# Patient Record
Sex: Male | Born: 1990 | Race: Black or African American | Hispanic: No | Marital: Single | Smoking: Current some day smoker
Health system: Southern US, Community
[De-identification: ages and names within clinical notes are randomized; demographics above are authoritative.]

## PROBLEM LIST (undated history)

## (undated) DIAGNOSIS — J45909 Unspecified asthma, uncomplicated: Secondary | ICD-10-CM

---

## 2014-10-27 ENCOUNTER — Emergency Department (HOSPITAL_COMMUNITY)
Admission: EM | Admit: 2014-10-27 | Discharge: 2014-10-27 | Disposition: A | Discharge status: Home / Self Care | Payer: Federal, State, Local not specified - PPO | Attending: Emergency Medicine | Admitting: Emergency Medicine

## 2014-10-27 ENCOUNTER — Encounter (HOSPITAL_COMMUNITY): Payer: Self-pay | Admitting: Emergency Medicine

## 2014-10-27 DIAGNOSIS — M545 Low back pain: Secondary | ICD-10-CM | POA: Diagnosis present

## 2014-10-27 DIAGNOSIS — X58XXXA Exposure to other specified factors, initial encounter: Secondary | ICD-10-CM | POA: Insufficient documentation

## 2014-10-27 DIAGNOSIS — Y9389 Activity, other specified: Secondary | ICD-10-CM | POA: Diagnosis not present

## 2014-10-27 DIAGNOSIS — Y9289 Other specified places as the place of occurrence of the external cause: Secondary | ICD-10-CM | POA: Diagnosis not present

## 2014-10-27 DIAGNOSIS — S39012A Strain of muscle, fascia and tendon of lower back, initial encounter: Secondary | ICD-10-CM | POA: Diagnosis not present

## 2014-10-27 DIAGNOSIS — S29012A Strain of muscle and tendon of back wall of thorax, initial encounter: Secondary | ICD-10-CM

## 2014-10-27 DIAGNOSIS — Z880 Allergy status to penicillin: Secondary | ICD-10-CM | POA: Insufficient documentation

## 2014-10-27 DIAGNOSIS — Z720 Tobacco use: Secondary | ICD-10-CM | POA: Diagnosis not present

## 2014-10-27 DIAGNOSIS — Y998 Other external cause status: Secondary | ICD-10-CM | POA: Diagnosis not present

## 2014-10-27 DIAGNOSIS — J45909 Unspecified asthma, uncomplicated: Secondary | ICD-10-CM | POA: Diagnosis not present

## 2014-10-27 HISTORY — DX: Unspecified asthma, uncomplicated: J45.909

## 2014-10-27 MED ORDER — METHOCARBAMOL 500 MG PO TABS: 500.0000 mg | ORAL_TABLET | Freq: Two times a day (BID) | ORAL | Status: DC

## 2014-10-27 MED ORDER — IBUPROFEN 800 MG PO TABS: 800.0000 mg | ORAL_TABLET | Freq: Three times a day (TID) | ORAL | Status: DC

## 2014-10-27 NOTE — Discharge Instructions (Signed)
Mid-Back Strain with Rehab  A strain is an injury in which a tendon or muscle is torn. The muscles and tendons of the mid-back are vulnerable to strains. However, these muscles and tendons are very strong and require a great force to be injured. The muscles of the mid-back are responsible for stabilizing the spinal column, as well as spinal twisting (rotation). Strains are classified into three categories. Grade 1 strains cause pain, but the tendon is not lengthened. Grade 2 strains include a lengthened ligament, due to the ligament being stretched or partially ruptured. With grade 2 strains there is still function, although the function may be decreased. Grade 3 strains involve a complete tear of the tendon or muscle, and function is usually impaired. SYMPTOMS   Pain in the middle of the back.  Pain that may affect only one side, and is worse with movement.  Muscle spasms, and often swelling in the back.  Loss of strength of the back muscles.  Crackling sound (crepitation) when the muscles are touched. CAUSES  Mid-back strains occur when a force is placed on the muscles or tendons that is greater than they can handle. Common causes of injury include:  Ongoing overuse of the muscle-tendon units in the middle back, usually from incorrect body posture.  A single violent injury or force applied to the back. RISK INCREASES WITH:  Sports that involve twisting forces on the spine or a lot of bending at the waist (football, rugby, weightlifting, bowling, golf, tennis, speed skating, racquetball, swimming, running, gymnastics, diving).  Poor strength and flexibility.  Failure to warm up properly before activity.  Family history of low back pain or disk disorders.  Previous back injury or surgery (especially fusion). PREVENTION  Learn and use proper sports technique.  Warm up and stretch properly before activity.  Allow for adequate recovery between workouts.  Maintain physical  fitness:  Strength, flexibility, and endurance.  Cardiovascular fitness. PROGNOSIS  If treated properly, mid-back strains usually heal within 6 weeks. RELATED COMPLICATIONS   Frequently recurring symptoms, resulting in a chronic problem. Properly treating the problem the first time decreases frequency of recurrence.  Chronic inflammation, scarring, and partial muscle-tendon tear.  Delayed healing or resolution of symptoms, especially if activity is resumed too soon.  Prolonged disability. TREATMENT Treatment first involves the use of ice and medicine, to reduce pain and inflammation. As the pain begins to subside, you may begin strengthening and stretching exercises to improve body posture and sport technique. These exercises may be performed at home or with a therapist. Severe injuries may require referral to a therapist for further evaluation and treatment, such as ultrasound. Corticosteroid injections may be given to help reduce inflammation. Biofeedback (watching monitors of your body processes) and psychotherapy may also be prescribed. Prolonged bed rest is felt to do more harm than good. Massage may help break the muscle spasms. Sometimes, an injection of cortisone, with or without local anesthetics, may be given to help relieve the pain and spasms. MEDICATION   If pain medicine is needed, nonsteroidal anti-inflammatory medicines (aspirin and ibuprofen), or other minor pain relievers (acetaminophen), are often advised.  Do not take pain medicine for 7 days before surgery.  Prescription pain relievers may be given, if your caregiver thinks they are needed. Use only as directed and only as much as you need.  Ointments applied to the skin may be helpful.  Corticosteroid injections may be given by your caregiver. These injections should be reserved for the most serious cases, because   they may only be given a certain number of times. HEAT AND COLD:   Cold treatment (icing) should be  applied for 10 to 15 minutes every 2 to 3 hours for inflammation and pain, and immediately after activity that aggravates your symptoms. Use ice packs or an ice massage.  Heat treatment may be used before performing stretching and strengthening activities prescribed by your caregiver, physical therapist, or athletic trainer. Use a heat pack or a warm water soak. SEEK IMMEDIATE MEDICAL CARE IF:  Symptoms get worse or do not improve in 2 to 4 weeks, despite treatment.  You develop numbness, weakness, or loss of bowel or bladder function.  New, unexplained symptoms develop. (Drugs used in treatment may produce side effects.) EXERCISES RANGE OF MOTION (ROM) AND STRETCHING EXERCISES - Mid-Back Strain These exercises may help you when beginning to rehabilitate your injury. In order to successfully resolve your symptoms, you must improve your posture. These exercises are designed to help reduce the forward-head and rounded-shoulder posture which contributes to this condition. Your symptoms may resolve with or without further involvement from your physician, physical therapist or athletic trainer. While completing these exercises, remember:   Restoring tissue flexibility helps normal motion to return to the joints. This allows healthier, less painful movement and activity.  An effective stretch should be held for at least 30 seconds.  A stretch should never be painful. You should only feel a gentle lengthening or release in the stretched tissue. STRETCH - Axial Extension  Stand or sit on a firm surface. Assume a good posture: chest up, shoulders drawn back, stomach muscles slightly tense, knees unlocked (if standing) and feet hip width apart.  Slowly retract your chin, so your head slides back and your chin slightly lowers. Continue to look straight ahead.  You should feel a gentle stretch in the back of your head. Be certain not to feel an aggressive stretch since this can cause headaches  later.  Hold for __________ seconds. Repeat __________ times. Complete this exercise __________ times per day. RANGE OF MOTION- Upper Thoracic Extension  Sit on a firm chair with a high back. Assume a good posture: chest up, shoulders drawn back, abdominal muscles slightly tense, and feet hip width apart. Place a small pillow or folded towel in the curve of your lower back, if you are having difficulty maintaining good posture.  Gently brace your neck with your hands, allowing your arms to rest on your chest.  Continue to support your neck and slowly extend your back over the chair. You will feel a stretch across your upper back.  Hold __________ seconds. Slowly return to the starting position. Repeat __________ times. Complete this exercise __________ times per day. RANGE OF MOTION- Mid-Thoracic Extension  Roll a towel so that it is about 4 inches in diameter.  Position the towel lengthwise. Lay on the towel so that your spine, but not your shoulder blades, are supported.  You should feel your mid-back arching toward the floor. To increase the stretch, extend your arms away from your body.  Hold for __________ seconds. Repeat exercise __________ times, __________ times per day. STRENGTHENING EXERCISES - Mid-Back Strain These exercises may help you when beginning to rehabilitate your injury. They may resolve your symptoms with or without further involvement from your physician, physical therapist or athletic trainer. While completing these exercises, remember:   Muscles can gain both the endurance and the strength needed for everyday activities through controlled exercises.  Complete these exercises as instructed by   your physician, physical therapist or athletic trainer. Increase the resistance and repetitions only as guided by your caregiver.  You may experience muscle soreness or fatigue, but the pain or discomfort you are trying to eliminate should never worsen during these  exercises. If this pain does worsen, stop and make certain you are following the directions exactly. If the pain is still present after adjustments, discontinue the exercise until you can discuss the trouble with your caregiver. STRENGTHENING - Quadruped, Opposite UE/LE Lift  Assume a hands and knees position on a firm surface. Keep your hands under your shoulders and your knees under your hips. You may place padding under your knees for comfort.  Find your neutral spine and gently tense your abdominal muscles so that you can maintain this position. Your shoulders and hips should form a rectangle that is parallel with the floor and is not twisted.  Keeping your trunk steady, lift your right hand no higher than your shoulder and then your left leg no higher than your hip. Make sure you are not holding your breath. Hold this position __________ seconds.  Continuing to keep your abdominal muscles tense and your back steady, slowly return to your starting position. Repeat with the opposite arm and leg. Repeat __________ times. Complete this exercise __________ times per day.  STRENGTH - Shoulder Extensors  Secure a rubber exercise band or tubing to a fixed object (table, pole) so that it is at the height of your shoulders when you are either standing, or sitting on a firm armless chair.  With a thumbs-up grip, grasp an end of the band in each hand. Straighten your elbows and lift your hands straight in front of you at shoulder height. Step back away from the secured end of band, until it becomes tense.  Squeezing your shoulder blades together, pull your hands down to the sides of your thighs. Do not allow your hands to go behind you.  Hold for __________ seconds. Slowly ease the tension on the band, as you reverse the directions and return to the starting position. Repeat __________ times. Complete this exercise __________ times per day.  STRENGTH - Horizontal Abductors Choose one of the two  positions to complete this exercise. Prone: lying on stomach:  Lie on your stomach on a firm surface so that your right / left arm overhangs the edge. Rest your forehead on your opposite forearm. With your palm facing the floor and your elbow straight, hold a __________ weight in your hand.  Squeeze your right / left shoulder blade to your mid-back spine and then slowly raise your arm to the height of the bed.  Hold for __________ seconds. Slowly reverse the directions and return to the starting position, controlling the weight as you lower your arm. Repeat __________ times. Complete this exercise __________ times per day. Standing:   Secure a rubber exercise band or tubing, so that it is at the height of your shoulders when you are either standing, or sitting on a firm armless chair.  Grasp an end of the band in each hand and have your palms face each other. Straighten your elbows and lift your hands straight in front of you at shoulder height. Step back away from the secured end of band, until it becomes tense.  Squeeze your shoulder blades together. Keeping your elbows locked and your hands at shoulder height, spread your arms apart, forming a "T" shape with your body. Hold __________ seconds. Slowly ease the tension on the band, as   you reverse the directions and return to the starting position. Repeat __________ times. Complete this exercise __________ times per day. STRENGTH - Scapular Retractors and External Rotators, Rowing  Secure a rubber exercise band or tubing, so that it is at the height of your shoulders when you are either standing, or sitting on a firm armless chair.  With a palm-down grip, grasp an end of the band in each hand. Straighten your elbows and lift your hands straight in front of you at shoulder height. Step back away from the secured end of band, until it becomes tense.  Step 1: Squeeze your shoulder blades together. Bending your elbows, draw your hands to your  chest as if you are rowing a boat. At the end of this motion, your hands and elbow should be at shoulder height and your elbows should be out to your sides.  Step 2: Rotate your shoulder to raise your hands above your head. Your forearms should be vertical and your upper arms should be horizontal.  Hold for __________ seconds. Slowly ease the tension on the band, as you reverse the directions and return to the starting position. Repeat __________ times. Complete this exercise __________ times per day.  POSTURE AND BODY MECHANICS CONSIDERATIONS - Mid-Back Strain Keeping correct posture when sitting, standing or completing your activities will reduce the stress put on different body tissues, allowing injured tissues a chance to heal and limiting painful experiences. The following are general guidelines for improved posture. Your physician or physical therapist will provide you with any instructions specific to your needs. While reading these guidelines, remember:  The exercises prescribed by your provider will help you have the flexibility and strength to maintain correct postures.  The correct posture provides the best environment for your joints to work. All of your joints have less wear and tear when properly supported by a spine with good posture. This means you will experience a healthier, less painful body.  Correct posture must be practiced with all of your activities, especially prolonged sitting and standing. Correct posture is as important when doing repetitive low-stress activities (typing) as it is when doing a single heavy-load activity (lifting). PROPER SITTING POSTURE In order to minimize stress and discomfort on your spine, you must sit with correct posture. Sitting with good posture should be effortless for a healthy body. Returning to good posture is a gradual process. Many people can work toward this most comfortably by using various supports until they have the flexibility and  strength to maintain this posture on their own. When sitting with proper posture, your ears will fall over your shoulders and your shoulders will fall over your hips. You should use the back of the chair to support your upper back. Your lower back will be in a neutral position, just slightly arched. You may place a small pillow or folded towel at the base of your low back for  support.  When working at a desk, create an environment that supports good, upright posture. Without extra support, muscles fatigue and lead to excessive strain on joints and other tissues. Keep these recommendations in mind: CHAIR:  A chair should be able to slide under your desk when your back makes contact with the back of the chair. This allows you to work closely.  The chair's height should allow your eyes to be level with the upper part of your monitor and your hands to be slightly lower than your elbows. BODY POSITION  Your feet should make contact with   the floor. If this is not possible, use a foot rest.  Keep your ears over your shoulders. This will reduce stress on your neck and lower back. INCORRECT SITTING POSTURES If you are feeling tired and unable to assume a healthy sitting posture, do not slouch or slump. This puts excessive strain on your back tissues, causing more damage and pain. Healthier options include:  Using more support, like a lumbar pillow.  Switching tasks to something that requires you to be upright or walking.  Talking a brief walk.  Lying down to rest in a neutral-spine position. CORRECT STANDING POSTURES Proper standing posture should be assumed with all daily activities, even if they only take a few moments, like when brushing your teeth. As in sitting, your ears should fall over your shoulders and your shoulders should fall over your hips. You should keep a slight tension in your abdominal muscles to brace your spine. Your tailbone should point down to the ground, not behind your  body, resulting in an over-extended swayback posture.  INCORRECT STANDING POSTURES Common incorrect standing postures include a forward head, locked knees, and an excessive swayback. WALKING Walk with an upright posture. Your ears, shoulders and hips should all line-up. CORRECT LIFTING TECHNIQUES DO :   Assume a wide stance. This will provide you more stability and the opportunity to get as close as possible to the object which you are lifting.  Tense your abdominals to brace your spine. Bend at the knees and hips. Keeping your back locked in a neutral-spine position, lift using your leg muscles. Lift with your legs, keeping your back straight.  Test the weight of unknown objects before attempting to lift them.  Try to keep your elbows locked down at your sides in order get the best strength from your shoulders when carrying an object.  Always ask for help when lifting heavy or awkward objects. INCORRECT LIFTING TECHNIQUES DO NOT:   Lock your knees when lifting, even if it is a small object.  Bend and twist. Pivot at your feet or move your feet when needing to change directions.  Assume that you can safely pick up even a paperclip without proper posture. Document Released: 06/11/2005 Document Revised: 10/26/2013 Document Reviewed: 09/23/2008 [employer] Patient Information 2015 [employer]. This information is not intended to replace advice given to you by your health care provider. Make sure you discuss any questions you have with your health care provider.  

## 2014-10-27 NOTE — ED Provider Notes (Signed)
CSN: [id number]     Arrival date & time 10/27/14  0005 History   First MD Initiated Contact with Patient 10/27/14 0046     Chief Complaint  Patient presents with  . Back Pain     (Consider location/radiation/quality/duration/timing/severity/associated sxs/prior Treatment) HPI  24 year old male presents for evaluation of back pain. Patient states that he recently started a new job 2 weeks ago working for [employer].  He has to do a lot of repetitive movement. Yesterday after torquing a wheel he pulled his R upper back muscle.  Report sharp throbbing pain with certain arm movement.  Pain is 7/10, worsening with movement and improves with rest and massage.  No associated fever, chills, neck pain, shortness of breath, pleuritic chest pain, productive cough, lightheadedness, dizziness, abdominal pain, or rash. He is here requesting a work note. No other complaint.  Past Medical History  Diagnosis Date  . Asthma    History reviewed. No pertinent past surgical history. History reviewed. No pertinent family history. History  Substance Use Topics  . Smoking status: Current Some Day Smoker  . Smokeless tobacco: Not on file  . Alcohol Use: No    Review of Systems  Constitutional: Negative for fever.  Cardiovascular: Negative for chest pain.  Musculoskeletal: Positive for back pain.  Skin: Negative for rash and wound.  Neurological: Negative for headaches.      Allergies  Penicillins  Home Medications   Prior to Admission medications   Not on File   BP 133/54 mmHg  Pulse 64  Temp(Src) 98.1 F (36.7 C) (Oral)  Resp 22  SpO2 100% Physical Exam  Constitutional: He appears well-developed and well-nourished. No distress.  African-American male appears to be in no acute distress, conversant.  HENT:  Head: Atraumatic.  Eyes: Conjunctivae are normal.  Neck: Neck supple.  No cervical midline spine tenderness, no nuchal rigidity.  Cardiovascular: Normal rate and regular rhythm.    Pulmonary/Chest: Effort normal and breath sounds normal. He exhibits no tenderness.  Abdominal: Soft. There is no tenderness.  Musculoskeletal: He exhibits tenderness (Tenderness to right parathoracic spinal muscle on palpation worsening with back rotation but no overlying skin changes or rash. No crepitus or emphysema. Right shoulder with full range of motion and normal grip strength. Sensation is intact to all extremit).  5/5 strength to bilateral upper extremities  Neurological: He is alert.  Skin: No rash noted.  Psychiatric: He has a normal mood and affect.  Nursing note and vitals reviewed.   ED Course  Procedures (including critical care time)  Patient presents with pain consistence with a muscle strain. Is afebrile with stable normal vital sign. Pain is reproducible on exam. Rice therapy discussed. He'll provided as requested. Low suspicion for ACS or PE causing his symptoms  Labs Review Labs Reviewed - No data to display  Imaging Review No results found.   EKG Interpretation None      MDM   Final diagnoses:  Strain of mid-back, initial encounter    BP 133/54 mmHg  Pulse 64  Temp(Src) 98.1 F (36.7 C) (Oral)  Resp 22  SpO2 100%     Fayrene HelperBowie Shakyla Nolley, PA-C 10/27/14 [id number]  Loren Raceravid Yelverton, MD 10/27/14 [phone]

## 2014-10-27 NOTE — ED Notes (Signed)
Pt reports he believes he pulled a muscle in his back yesterday.

## 2014-10-27 NOTE — ED Notes (Signed)
Pt complains of mid right back pain and right shoulder pain. Denies numbness and tingling. Pt describes pain as aching that started 2 weeks ago while at work. Denies taking any pain medication for symptoms.

## 2016-12-17 ENCOUNTER — Encounter (HOSPITAL_COMMUNITY): Payer: Self-pay

## 2016-12-17 ENCOUNTER — Emergency Department (HOSPITAL_COMMUNITY)
Admission: EM | Admit: 2016-12-17 | Discharge: 2016-12-17 | Disposition: A | Discharge status: Home / Self Care | Payer: Federal, State, Local not specified - PPO | Attending: Emergency Medicine | Admitting: Emergency Medicine

## 2016-12-17 DIAGNOSIS — K1120 Sialoadenitis, unspecified: Secondary | ICD-10-CM | POA: Diagnosis not present

## 2016-12-17 DIAGNOSIS — F17200 Nicotine dependence, unspecified, uncomplicated: Secondary | ICD-10-CM | POA: Diagnosis not present

## 2016-12-17 DIAGNOSIS — Z79899 Other long term (current) drug therapy: Secondary | ICD-10-CM | POA: Insufficient documentation

## 2016-12-17 DIAGNOSIS — J45909 Unspecified asthma, uncomplicated: Secondary | ICD-10-CM | POA: Diagnosis not present

## 2016-12-17 DIAGNOSIS — R220 Localized swelling, mass and lump, head: Secondary | ICD-10-CM | POA: Diagnosis present

## 2016-12-17 NOTE — Discharge Instructions (Signed)
Warm compresses to the swollen side. Sour candy. Follow up with family doctor or return if not improving or worsening.

## 2016-12-17 NOTE — ED Notes (Addendum)
Rt side of face swelling since yesterday , was eating crackers and felt tingling and face swelling  Started , took some allergy meds and it went down and now  This am face is swollen agagin,, pt handling his secreations well no sob

## 2016-12-17 NOTE — ED Triage Notes (Signed)
Pt states he was eating a peanut butter cracker yesterday and began to feel tingling in his face. He reports he also noticed facial swelling to the right side. Some swelling noted, no broken teeth noted. Pt denies any hx of allergy to peanut butter. Pt denies SOB or swelling in the throat.

## 2016-12-17 NOTE — ED Provider Notes (Signed)
[facility]-EMERGENCY DEPT Provider Note   CSN: [id number] Arrival date & time: 12/17/16  1224  By signing my name below, I, Ryan Sweeney, attest that this documentation has been prepared under the direction and in the presence of Ryan Sweeney. Electronically Signed: Thelma BargeNick Sweeney, Scribe. 12/17/16. 2:59 PM.  History   Chief Complaint Chief Complaint  Patient presents with  . Facial Swelling   The history is provided by the patient. No language interpreter was used.   HPI Comments: Ryan Sweeney is a 26 y.o. male who presents to the Emergency Department complaining of constant, gradually worsening right-sided facial swelling since yesterday. He states his face is only mildly painful. He notes he was eating a peanut butter cracker when his symptoms arose. He took allergy medications and ibuprofen with some improvement. He denies any new vitamins or OTC medications, or any allergies to peanut butter. He denies ear pain, dental pain, fever, chills, difficulty swallowing, swelling in his throat, sore throat, rash, itching, swelling to lips, or pain when he moves his mouth. He has no pertinent medical histories.   Past Medical History:  Diagnosis Date  . Asthma     There are no active problems to display for this patient.   History reviewed. No pertinent surgical history.     Home Medications    Prior to Admission medications   Medication Sig Start Date End Date Taking? Authorizing Provider  ibuprofen (ADVIL,MOTRIN) 800 MG tablet Take 1 tablet (800 mg total) by mouth 3 (three) times daily. 10/27/14   Fayrene Helperran, [place], PA-C  methocarbamol (ROBAXIN) 500 MG tablet Take 1 tablet (500 mg total) by mouth 2 (two) times daily. 10/27/14   Fayrene Helperran, [place], PA-C    Family History History reviewed. No pertinent family history.  Social History Social History  Substance Use Topics  . Smoking status: Current Some Day Smoker  . Smokeless tobacco: Never Used  . Alcohol use No     Allergies     Penicillins   Review of Systems Review of Systems  Constitutional: Negative for chills and fever.  HENT: Positive for facial swelling. Negative for dental problem, ear pain, sore throat and trouble swallowing.   Skin: Negative for rash.     Physical Exam Updated Vital Signs BP (!) 138/108 (BP Location: Right Arm)   Pulse 75   Temp 98.4 F (36.9 C) (Oral)   Resp 16   SpO2 98%   Physical Exam  Constitutional: He is oriented to person, place, and time. He appears well-developed and well-nourished.  HENT:  Head: Normocephalic and atraumatic.  Normal lips, tongue, throat. Normal oropharynx. Normal external ear, ear canals, TMs bilaterally. Mild fullness in right parotid gland with no tenderness. No trismus. No swelling under the tongue or submandibular area  Cardiovascular: Normal rate.   Pulmonary/Chest: Effort normal.  Neurological: He is alert and oriented to person, place, and time.  Skin: Skin is warm and dry.  Psychiatric: He has a normal mood and affect.  Nursing note and vitals reviewed.    ED Treatments / Results  DIAGNOSTIC STUDIES: Oxygen Saturation is 98% on RA, normal by my interpretation.    COORDINATION OF CARE: 2:54 PM Discussed treatment plan with pt at bedside and pt agreed to plan.  Labs (all labs ordered are listed, but only abnormal results are displayed) Labs Reviewed - No data to display  EKG  EKG Interpretation None       Radiology No results found.  Procedures Procedures (including critical care time)  Medications Ordered  in ED Medications - No data to display   Initial Impression / Assessment and Plan / ED Course  I have reviewed the triage vital signs and the nursing notes.  Pertinent labs & imaging results that were available during my care of the patient were reviewed by me and considered in my medical decision making (see chart for details).     Pt in ed with non tender swelling over right parotic. Doubt infectious  process, he is afebrile, no other symptoms. Question sialolithiasis. Will try warm compresses, sour candies, follow up with PCP if not improving. Return precautions discussed.   Vitals:   12/17/16 1230  BP: (!) 138/108  Pulse: 75  Resp: 16  Temp: 98.4 F (36.9 C)  TempSrc: Oral  SpO2: 98%     Final Clinical Impressions(s) / ED Diagnoses   Final diagnoses:  Parotitis    New Prescriptions New Prescriptions   No medications on file   I personally performed the services described in this documentation, which was scribed in my presence. The recorded information has been reviewed and is accurate.    Jaynie Crumble, PA-C 12/17/16 1520    Lavera Guise, MD 12/17/16 2157

## 2018-12-26 ENCOUNTER — Emergency Department (HOSPITAL_COMMUNITY)
Admission: EM | Admit: 2018-12-26 | Discharge: 2018-12-27 | Disposition: A | Discharge status: Home / Self Care | Payer: Self-pay | Attending: Emergency Medicine | Admitting: Emergency Medicine

## 2018-12-26 ENCOUNTER — Encounter (HOSPITAL_COMMUNITY): Payer: Self-pay

## 2018-12-26 ENCOUNTER — Emergency Department (HOSPITAL_COMMUNITY): Payer: Self-pay

## 2018-12-26 ENCOUNTER — Other Ambulatory Visit: Payer: Self-pay

## 2018-12-26 DIAGNOSIS — M546 Pain in thoracic spine: Secondary | ICD-10-CM | POA: Insufficient documentation

## 2018-12-26 DIAGNOSIS — Z79899 Other long term (current) drug therapy: Secondary | ICD-10-CM | POA: Insufficient documentation

## 2018-12-26 DIAGNOSIS — R1013 Epigastric pain: Secondary | ICD-10-CM | POA: Insufficient documentation

## 2018-12-26 DIAGNOSIS — F17210 Nicotine dependence, cigarettes, uncomplicated: Secondary | ICD-10-CM | POA: Insufficient documentation

## 2018-12-26 MED ORDER — LIDOCAINE VISCOUS HCL 2 % MT SOLN
15.0000 mL | Freq: Once | OROMUCOSAL | Status: AC
  Administered 2018-12-26: 15 mL via ORAL
  Filled 2018-12-26: qty 15

## 2018-12-26 MED ORDER — ALUM & MAG HYDROXIDE-SIMETH 200-200-20 MG/5ML PO SUSP
30.0000 mL | Freq: Once | ORAL | Status: AC
  Administered 2018-12-26: 30 mL via ORAL
  Filled 2018-12-26: qty 30

## 2018-12-26 NOTE — ED Triage Notes (Signed)
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 He denies and sick contacts. Reports symptoms since Monday. Also endorsing some back pain that he attributes to heavy lifting.

## 2018-12-26 NOTE — ED Provider Notes (Signed)
[facility] DEPT Provider Note   CSN: [id number] Arrival date & time: 12/26/18  2152     History   Chief Complaint Chief Complaint  Patient presents with  . Shortness of Breath    HPI Ryan Sweeney is a 28 y.o. male.     The history is provided by the patient. No language interpreter was used.  Shortness of Breath    28 year old male with history of asthma presenting for evaluation of chest pain shortness of breath.  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  4 days ago after work he developed pain to his right lower back.  He described pain as a sharp sensation worsening with certain movement.  He has been taking ibuprofen for the pain and now he developed pain to his epigastric midsternal region.  Described pain as a sharp sensation radiates to his left chest worse after eating, and with movement.  States pain is moderate in severity.  He does not complain of any associated fever, productive cough, wheezing, lightheadedness, dizziness or abdominal pain.  He has been taking Advil for his back pain without adequate relief.  He denies any recent sick contact or any recent travel.  Past Medical History:  Diagnosis Date  . Asthma     There are no active problems to display for this patient.   History reviewed. No pertinent surgical history.      Home Medications    Prior to Admission medications   Medication Sig Start Date End Date Taking? Authorizing Provider  acetaminophen (TYLENOL) 325 MG tablet Take 650 mg by mouth every 6 (six) hours as needed for moderate pain.   Yes [provider]  ibuprofen (ADVIL,MOTRIN) 800 MG tablet Take 1 tablet (800 mg total) by mouth 3 (three) times daily. Patient not taking: Reported on 12/26/2018 10/27/14   Domenic Moras, PA-C  methocarbamol (ROBAXIN) 500 MG tablet Take 1 tablet (500 mg total) by mouth 2 (two) times daily. Patient not taking: Reported on 12/26/2018 10/27/14    Domenic Moras, PA-C    Family History History reviewed. No pertinent family history.  Social History Social History   Tobacco Use  . Smoking status: Current Some Day Smoker  . Smokeless tobacco: Never Used  Substance Use Topics  . Alcohol use: No  . Drug use: No     Allergies   Penicillins   Review of Systems Review of Systems  Respiratory: Positive for shortness of breath.   All other systems reviewed and are negative.    Physical Exam Updated Vital Signs BP (!) 152/81   Pulse 91   Temp 98.5 F (36.9 C) (Oral)   Resp 17   SpO2 100%   Physical Exam Vitals signs and nursing note reviewed.  Constitutional:      General: He is not in acute distress.    Appearance: He is well-developed.  HENT:     Head: Atraumatic.  Eyes:     Conjunctiva/sclera: Conjunctivae normal.  Neck:     Musculoskeletal: Neck supple.  Cardiovascular:     Rate and Rhythm: Normal rate and regular rhythm.     Pulses: Normal pulses.     Heart sounds: Normal heart sounds.  Pulmonary:     Effort: Pulmonary effort is normal.     Breath sounds: Normal breath sounds.  Chest:     Chest wall: Tenderness (Tenderness to epigastric region and left lower chest on palpation without any overlying skin changes.) present.  Musculoskeletal:        General: Tenderness (Tenderness to right parathoracic spinal muscle on palpation without overlying skin changes.) present.  Skin:    Capillary Refill: Capillary refill takes less than 2 seconds.     Findings: No rash.  Neurological:     Mental Status: He is alert.  Psychiatric:        Mood and Affect: Mood normal.      ED Treatments / Results  Labs (all labs ordered are listed, but only abnormal results are displayed) Labs Reviewed - No data to display  EKG None  Radiology Dg Chest Portable 1 View  Result Date: 12/26/2018 CLINICAL DATA:  Shortness of breath. EXAM: PORTABLE CHEST 1 VIEW COMPARISON:  None. FINDINGS: The heart size and mediastinal  contours are within normal limits. Both lungs are clear. The visualized skeletal structures are unremarkable. IMPRESSION: No active disease. Electronically Signed   By: Gerome Samavid  Williams III M.D   On: 12/26/2018 23:49    Procedures Procedures (including critical care time)  Medications Ordered in ED Medications  alum & mag hydroxide-simeth (MAALOX/MYLANTA) 200-200-20 MG/5ML suspension 30 mL (30 mLs Oral Given 12/26/18 2355)    And  lidocaine (XYLOCAINE) 2 % viscous mouth solution 15 mL (15 mLs Oral Given 12/26/18 2355)     Initial Impression / Assessment and Plan / ED Course  I have reviewed the triage vital signs and the nursing notes.  Pertinent labs & imaging results that were available during my care of the patient were reviewed by me and considered in my medical decision making (see chart for details).        BP (!) 152/81   Pulse 91   Temp 98.5 F (36.9 C) (Oral)   Resp 17   SpO2 100%    Final Clinical Impressions(s) / ED Diagnoses   Final diagnoses:  Epigastric pain  Acute right-sided thoracic back pain    ED Discharge Orders         Ordered    methocarbamol (ROBAXIN) 500 MG tablet  2 times daily     12/27/18 0053    omeprazole (PRILOSEC) 20 MG capsule  2 times daily before meals     12/27/18 0053         11:39 PM Patient complaining of pain to his right lower back as well has midsternal epigastric chest pain.  I suspect low back pain is likely is musculoskeletal as it is reproducible and worsening with movement.  I suspect epigastric pain is likely due to gastritis from taking NSAIDs for his back pain.  I have low suspicion for pneumonia, PE, or asthma exacerbation.  No wheezing on exam.  Patient denies any recent sick contact he does not have any symptoms to suggest COVID-19.  GI cocktail provided for his chest discomfort, chest x-ray ordered.  12:52 AM Improvement with of symptoms with GI cocktail.  Chest x-ray unremarkable.  Vital signs stable.  I suggested  earlier, symptoms likely musculoskeletal pain, will provide treatment for that.  Return precaution discussed.   Fayrene Helperran, Tanishia Lemaster, PA-C 12/27/18 [id number]    Geoffery Lyonselo, [place], MD 12/27/18 [phone]

## 2018-12-27 MED ORDER — OMEPRAZOLE 20 MG PO CPDR: 20.0000 mg | DELAYED_RELEASE_CAPSULE | Freq: Two times a day (BID) | ORAL | 0 refills | Status: AC

## 2018-12-27 MED ORDER — METHOCARBAMOL 500 MG PO TABS: 500.0000 mg | ORAL_TABLET | Freq: Two times a day (BID) | ORAL | 0 refills | Status: AC

## 2020-11-13 IMAGING — DX PORTABLE CHEST - 1 VIEW
1 series · 1 of 1 positions shown · non-contrast
Comparison: None.

CLINICAL DATA: Shortness of breath.

EXAM:
PORTABLE CHEST 1 VIEW

[chest ap]
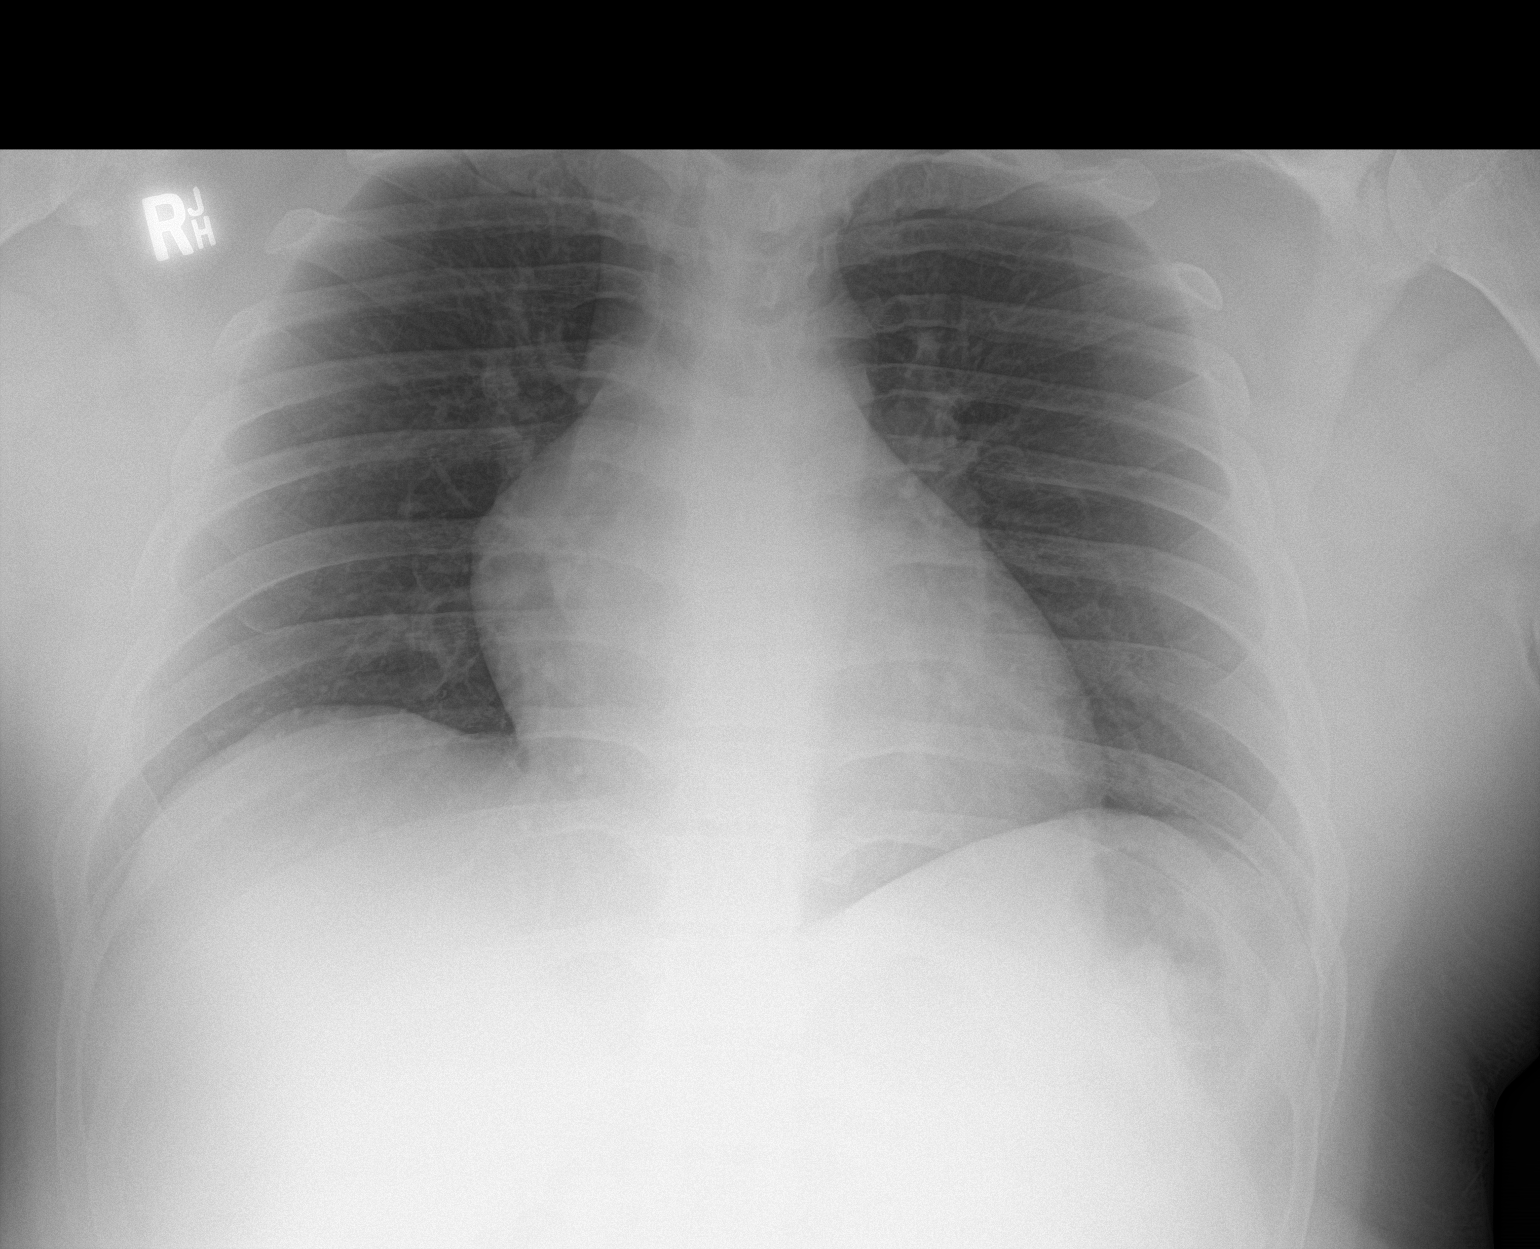

[1 of 1 positions shown; findings below may reference images not displayed]

FINDINGS: The heart size and mediastinal contours are within normal limits.
Both lungs are clear. The visualized skeletal structures are
unremarkable.
IMPRESSION: No active disease.
